# Patient Record
Sex: Female | Born: 1965 | Race: White | Hispanic: No | Smoking: Former smoker
Health system: Southern US, Community
[De-identification: ages and names within clinical notes are randomized; demographics above are authoritative.]

## PROBLEM LIST (undated history)

## (undated) DIAGNOSIS — F32A Depression, unspecified: Secondary | ICD-10-CM

## (undated) DIAGNOSIS — F329 Major depressive disorder, single episode, unspecified: Secondary | ICD-10-CM

## (undated) HISTORY — PX: CARPAL TUNNEL RELEASE: SHX101

---

## 2000-09-06 ENCOUNTER — Emergency Department (HOSPITAL_COMMUNITY): Admission: EM | Admit: 2000-09-06 | Discharge: 2000-09-06 | Payer: Self-pay | Admitting: Emergency Medicine

## 2005-09-21 ENCOUNTER — Emergency Department: Payer: Self-pay | Admitting: Emergency Medicine

## 2009-04-02 ENCOUNTER — Emergency Department: Payer: Self-pay | Admitting: Emergency Medicine

## 2014-05-08 ENCOUNTER — Emergency Department: Payer: Self-pay | Admitting: Emergency Medicine

## 2014-05-08 LAB — CBC
HCT: 33.9 % — AB (ref 35.0–47.0)
HGB: 10.9 g/dL — AB (ref 12.0–16.0)
MCH: 23.6 pg — AB (ref 26.0–34.0)
MCHC: 32.2 g/dL (ref 32.0–36.0)
MCV: 73 fL — AB (ref 80–100)
Platelet: 239 10*3/uL (ref 150–440)
RBC: 4.62 10*6/uL (ref 3.80–5.20)
RDW: 18.3 % — ABNORMAL HIGH (ref 11.5–14.5)
WBC: 8.7 10*3/uL (ref 3.6–11.0)

## 2014-05-08 LAB — WET PREP, GENITAL

## 2014-05-08 LAB — PROTIME-INR
INR: 1
Prothrombin Time: 12.7 secs (ref 11.5–14.7)

## 2014-05-08 LAB — GC/CHLAMYDIA PROBE AMP

## 2016-02-28 ENCOUNTER — Emergency Department: Payer: BLUE CROSS/BLUE SHIELD

## 2016-02-28 ENCOUNTER — Encounter: Payer: Self-pay | Admitting: Emergency Medicine

## 2016-02-28 ENCOUNTER — Emergency Department
Admission: EM | Admit: 2016-02-28 | Discharge: 2016-02-28 | Disposition: A | Discharge status: Home / Self Care | Payer: BLUE CROSS/BLUE SHIELD | Attending: Emergency Medicine | Admitting: Emergency Medicine

## 2016-02-28 DIAGNOSIS — Z87891 Personal history of nicotine dependence: Secondary | ICD-10-CM | POA: Insufficient documentation

## 2016-02-28 DIAGNOSIS — M25562 Pain in left knee: Secondary | ICD-10-CM

## 2016-02-28 DIAGNOSIS — F329 Major depressive disorder, single episode, unspecified: Secondary | ICD-10-CM | POA: Diagnosis not present

## 2016-02-28 DIAGNOSIS — M25462 Effusion, left knee: Secondary | ICD-10-CM | POA: Diagnosis not present

## 2016-02-28 HISTORY — DX: Depression, unspecified: F32.A

## 2016-02-28 HISTORY — DX: Major depressive disorder, single episode, unspecified: F32.9

## 2016-02-28 MED ORDER — NAPROXEN 500 MG PO TABS
500.0000 mg | ORAL_TABLET | Freq: Once | ORAL | Status: AC
  Administered 2016-02-28: 500 mg via ORAL

## 2016-02-28 MED ORDER — NAPROXEN 500 MG PO TABS
ORAL_TABLET | ORAL | Status: AC
  Administered 2016-02-28: 500 mg via ORAL
  Filled 2016-02-28: qty 1

## 2016-02-28 NOTE — Discharge Instructions (Signed)
Knee Pain Knee pain is a common problem. It can have many causes. The pain often goes away by following your doctor's home care instructions. Treatment for ongoing pain will depend on the cause of your pain. If your knee pain continues, more tests may be needed to diagnose your condition. Tests may include X-rays or other imaging studies of your knee. HOME CARE  Take medicines only as told by your doctor.  Rest your knee and keep it raised (elevated) while you are resting.  Do not do things that cause pain or make your pain worse.  Avoid activities where both feet leave the ground at the same time, such as running, jumping rope, or doing jumping jacks.  Apply ice to the knee area:  Put ice in a plastic bag.  Place a towel between your skin and the bag.  Leave the ice on for 20 minutes, 2-3 times a day.  Ask your doctor if you should wear an elastic knee support.  Sleep with a pillow under your knee.  Lose weight if you are overweight. Being overweight can make your knee hurt more.  Do not use any tobacco products, including cigarettes, chewing tobacco, or electronic cigarettes. If you need help quitting, ask your doctor. Smoking may slow the healing of any bone and joint problems that you may have. GET HELP IF:  Your knee pain does not stop, it changes, or it gets worse.  You have a fever along with knee pain.  Your knee gives out or locks up.  Your knee becomes more swollen. GET HELP RIGHT AWAY IF:   Your knee feels hot to the touch.  You have chest pain or trouble breathing.   This information is not intended to replace advice given to you by your health care provider. Make sure you discuss any questions you have with your health care provider.   Document Released: 11/07/2008 Document Revised: 09/01/2014 Document Reviewed: 10/12/2013 Elsevier Interactive Patient Education 2016 Elsevier Inc.  Knee Effusion Knee effusion means that you have extra fluid in your knee.  This can cause pain. Your knee may be more difficult to bend and move. HOME CARE  Use crutches as told by your doctor.  Wear a knee brace as told by your doctor.  Apply ice to the swollen area:  Put ice in a plastic bag.  Place a towel between your skin and the bag.  Leave the ice on for 20 minutes, 2-3 times per day.  Keep your knee raised (elevated) when you are sitting or lying down.  Take medicines only as told by your doctor.  Do any rehabilitation or strengthening exercises as told by your doctor.  Rest your knee as told by your doctor. You may start doing your normal activities again when your doctor says it is okay.  Keep all follow-up visits as told by your doctor. This is important. GET HELP IF:   You continue to have pain in your knee. GET HELP RIGHT AWAY IF:  You have increased swelling or redness of your knee.  You have severe pain in your knee.  You have a fever.   This information is not intended to replace advice given to you by your health care provider. Make sure you discuss any questions you have with your health care provider.   Document Released: 09/13/2010 Document Revised: 09/01/2014 Document Reviewed: 03/27/2014 Elsevier Interactive Patient Education [employer] Elsevier Inc.

## 2016-02-28 NOTE — ED Notes (Signed)
Increasing left leg pain x1 year , "It seem to be getting worse" , no injury recalled

## 2016-02-28 NOTE — ED Provider Notes (Addendum)
[facility] Emergency Department Provider Note   ____________________________________________  Time seen: Approximately 320 PM  I have reviewed the triage vital signs and the nursing notes.   HISTORY  Chief Complaint Leg Pain   HPI Brooke Coffey is a 50 y.o. female with a history of depression was present in the emergency department with left knee pain over one year. She says that the pain has worsened lately. She says that the pain started about a year ago when she was working out at [employer]. 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 She says that she is on her feet constantly present says that she does have good shoes to wear but that the pain worsens throughout the day and radiates from the left knee up the thigh and down the lower leg as well. It is worse with movement. The patient has noted swelling of the knee and occasionally wears a compressive brace. Denies any calf or thigh swelling. No history of DVTs. Says that she has taken ibuprofen several times a week over the course of the pain and this helps.  Past Medical History  Diagnosis Date  . Depression     There are no active problems to display for this patient.   Past Surgical History  Procedure Laterality Date  . Cesarean section    . Carpal tunnel release      No current outpatient prescriptions on file.  Allergies Review of patient's allergies indicates no known allergies.  No family history on file.  Social History Social History  Substance Use Topics  . Smoking status: Former Games developermoker  . Smokeless tobacco: None  . Alcohol Use: Yes    Review of Systems Constitutional: No fever/chills Eyes: No visual changes. ENT: No sore throat. Cardiovascular: Denies chest pain. Respiratory: Denies shortness of breath. Gastrointestinal: No abdominal pain.  No nausea, no vomiting.  No diarrhea.  No constipation. Genitourinary: Negative  for dysuria. Musculoskeletal: Negative for back pain. Skin: Negative for rash. Neurological: Negative for headaches, focal weakness or numbness.  10-point ROS otherwise negative.  ____________________________________________   PHYSICAL EXAM:  VITAL SIGNS: ED Triage Vitals  Enc Vitals Group     BP 02/28/16 1324 143/102 mmHg     Pulse Rate 02/28/16 1324 79     Resp 02/28/16 1324 18     Temp 02/28/16 1324 98.3 F (36.8 C)     Temp Source 02/28/16 1324 Oral     SpO2 02/28/16 1324 99 %     Weight 02/28/16 1324 176 lb (79.833 kg)     Height 02/28/16 1324 5' (1.524 m)     Head Cir --      Peak Flow --      Pain Score 02/28/16 1325 7     Pain Loc --      Pain Edu? --      Excl. in GC? --     Constitutional: Alert and oriented. Well appearing and in no acute distress. Eyes: Conjunctivae are normal. PERRL. EOMI. Head: Atraumatic. Nose: No congestion/rhinnorhea. Mouth/Throat: Mucous membranes are moist.   Neck: No stridor.   Cardiovascular: Normal rate, regular rhythm. Grossly normal heart sounds.  Good peripheral circulation With intact in bilateral dorsalis pedis pulses. Respiratory: Normal respiratory effort.  No retractions. Lungs CTAB. Gastrointestinal: Soft and nontender. No distention. No CVA tenderness. Musculoskeletal: Left knee with a small effusion. No ligamentous laxity. Mild tenderness throughout. The patient is able  to range her knee but with some pain diffusely. No weakness. Sensation is intact to light touch. No swelling to the calf or thigh. Neurologic:  Normal speech and language. No gross focal neurologic deficits are appreciated.  Skin:  Skin is warm, dry and intact. No rash noted. Psychiatric: Mood and affect are normal. Speech and behavior are normal.  ____________________________________________   LABS (all labs ordered are listed, but only abnormal results are displayed)  Labs Reviewed - No data to  display ____________________________________________  EKG   ____________________________________________  RADIOLOGY     DG Knee Complete 4 Views Left (Final result) Result time: 02/28/16 15:55:19   Final result by Rad Results In Interface (02/28/16 15:55:19)   Narrative:   CLINICAL DATA: Left knee pain for 1 year with increasing symptoms over the last week, no known injury, initial encounter  EXAM: LEFT KNEE - COMPLETE 4+ VIEW  COMPARISON: None.  FINDINGS: No evidence of fracture, dislocation, or joint effusion. No evidence of arthropathy or other focal bone abnormality. Soft tissues are unremarkable.  IMPRESSION: No acute abnormality noted.   Electronically Signed By: Alcide CleverMark Lukens M.D. On: 02/28/2016 15:55    US Venous Img Lower Unilateral Left (Final result) Result time: 02/28/16 16:51:33   Final result by Rad Results In Interface (02/28/16 16:51:33)   Narrative:   CLINICAL DATA: Left knee pain and swelling.  EXAM: Left LOWER EXTREMITY VENOUS DOPPLER ULTRASOUND  TECHNIQUE: Gray-scale sonography with graded compression, as well as color Doppler and duplex ultrasound were performed to evaluate the lower extremity deep venous systems from the level of the common femoral vein and including the common femoral, femoral, profunda femoral, popliteal and calf veins including the posterior tibial, peroneal and gastrocnemius veins when visible. The superficial great saphenous vein was also interrogated. Spectral Doppler was utilized to evaluate flow at rest and with distal augmentation maneuvers in the common femoral, femoral and popliteal veins.  COMPARISON: None.  FINDINGS: Contralateral Common Femoral Vein: Respiratory phasicity is normal and symmetric with the symptomatic side. No evidence of thrombus. Normal compressibility.  Common Femoral Vein: No evidence of thrombus.  Saphenofemoral Junction: No evidence of thrombus.  Profunda Femoral  Vein: No evidence of thrombus.  Femoral Vein: No evidence of thrombus.  Popliteal Vein: No evidence of thrombus.  Calf Veins: No evidence of thrombus.  IMPRESSION: Negative for left lower extremity DVT.   Electronically Signed By: Marnee SpringJonathon Watts M.D. On: 02/28/2016 16:51        ____________________________________________   PROCEDURES   Procedures   ____________________________________________   INITIAL IMPRESSION / ASSESSMENT AND PLAN / ED COURSE  Pertinent labs & imaging results that were available during my care of the patient were reviewed by me and considered in my medical decision making (see chart for details).  History and physical exam are consistent with arthritis and arthritic effusion. We will obtain knee radiographs. Ran likely to be a DVT. No calf or thigh swelling. Ongoing pain for a year which makes that much less likely as well.  ----------------------------------------- 4:19 PM on 02/28/2016 -----------------------------------------  No arthritis found on the x-ray of the knee. Will continue with lower extremity ultrasound to make sure there is no DVT. If DVT study is negative and I will refer the patient orthopedics. Although she does not have any sign of arthritis there is no obvious small effusion and this may be secondary to overuse that she is having symptoms.  ----------------------------------------- 5:43 PM on 02/28/2016 -----------------------------------------  Patient resting comfortably at this time and says  that the pain is been greatly improved with Naprosyn. Reassuring workup as far as her plain films and ultrasound go. However, does sound like the symptoms are likely musculoskeletal related. It is not ruled out. The patient could have a ligamentous injury or meniscus injury. She will continue supportive care at home. She says that she'll be picking up over-the-counter NSAIDs as well as supplements such as Biaxin. She'll follow up  with her primary care doctor for further workup for this left knee pain. I will also give the phone number for orthopedics. Will be discharged home. ____________________________________________   FINAL CLINICAL IMPRESSION(S) / ED DIAGNOSES  Left knee pain with effusion.   NEW MEDICATIONS STARTED DURING THIS VISIT:  New Prescriptions   No medications on file     Note:  This document was prepared using Dragon voice recognition software and may include unintentional dictation errors.    Myrna Blazer, MD 02/28/16 1744  Myrna Blazer, MD 02/28/16 1750  Blood pressure rechecked and was 150/88.  Myrna Blazer, MD 02/28/16 [phone]

## 2017-04-22 ENCOUNTER — Other Ambulatory Visit: Payer: Self-pay | Admitting: Nurse Practitioner

## 2017-04-22 DIAGNOSIS — Z1239 Encounter for other screening for malignant neoplasm of breast: Secondary | ICD-10-CM

## 2017-12-30 IMAGING — US US EXTREM LOW VENOUS*L*
1 series · 13 of 24 positions shown · non-contrast
Comparison: None.

CLINICAL DATA: Left knee pain and swelling.



[Series 1: us extrem low venous*left* · 0.08mm/px · 13 of 32 slices shown]
[im 1/32]
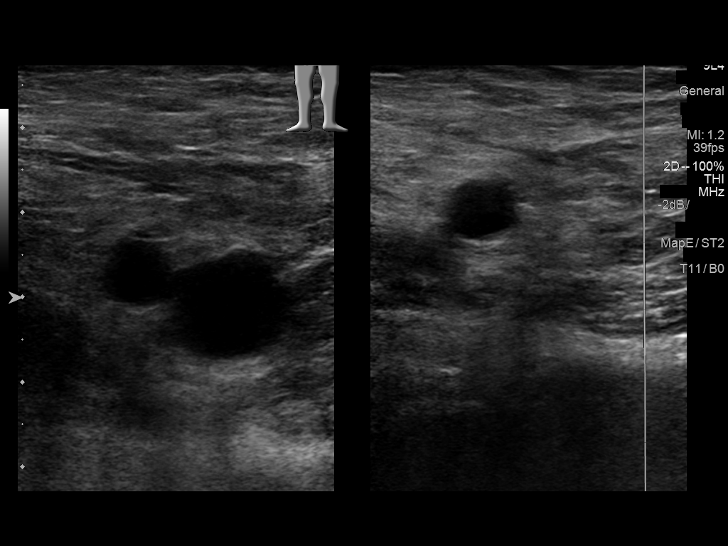
[im 3/32]
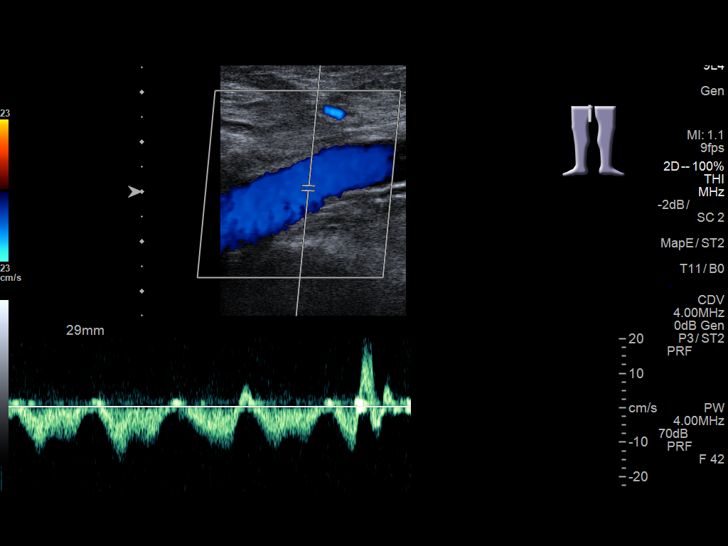
[im 6/32]
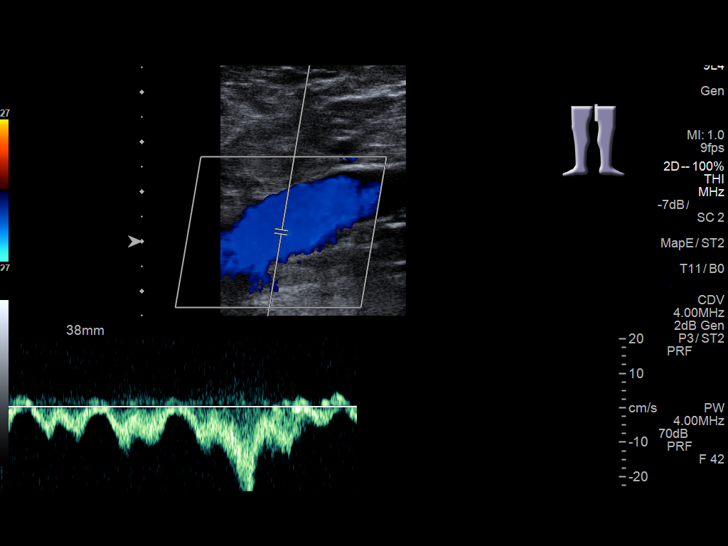
[im 9/32]
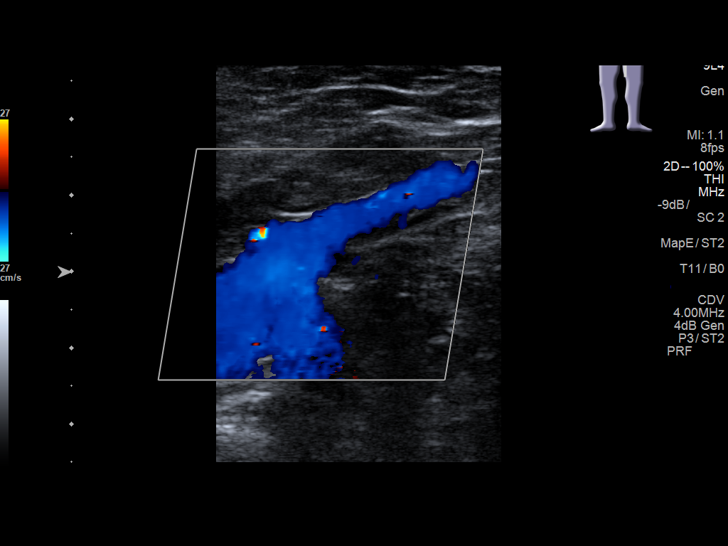
[im 11/32]
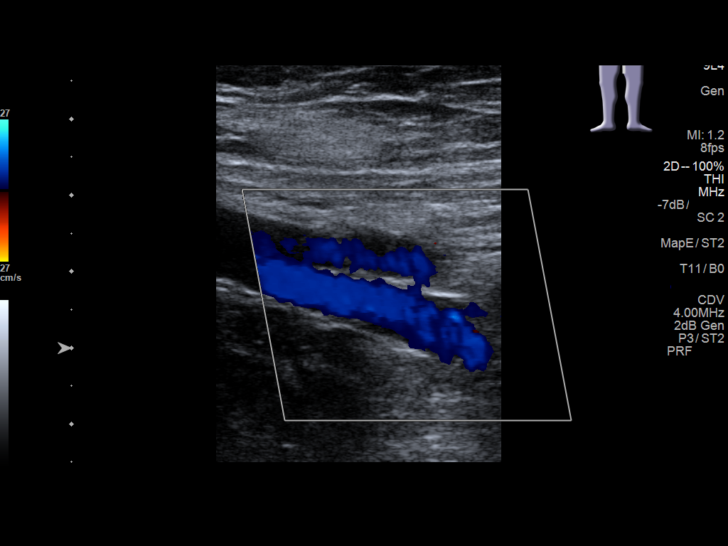
[im 14/32]
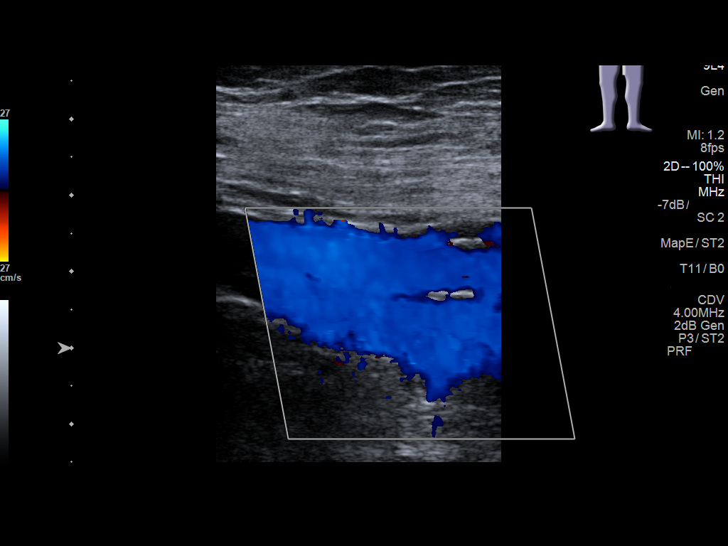
[im 17/32]
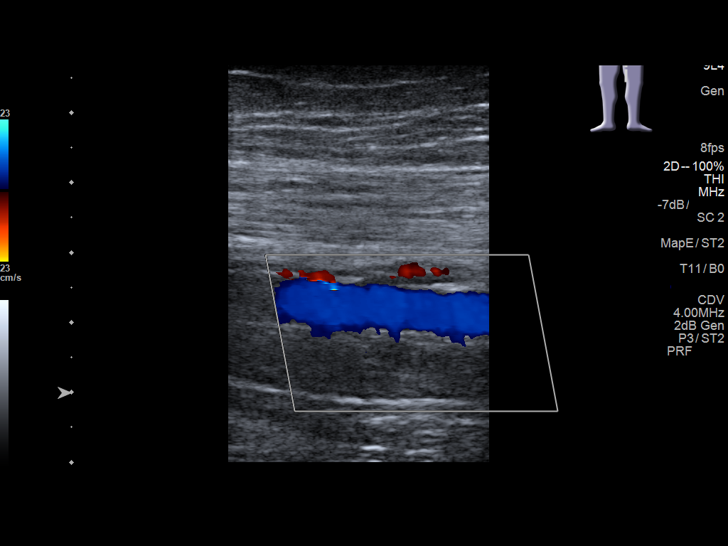
[im 18/32]
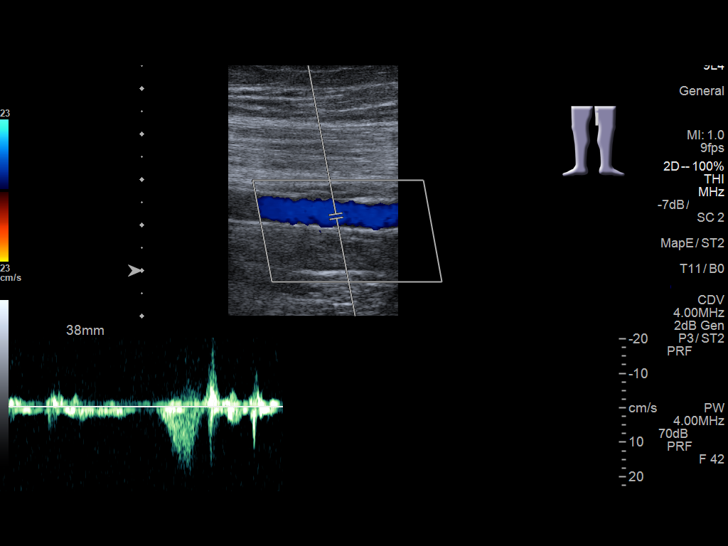
[im 21/32]
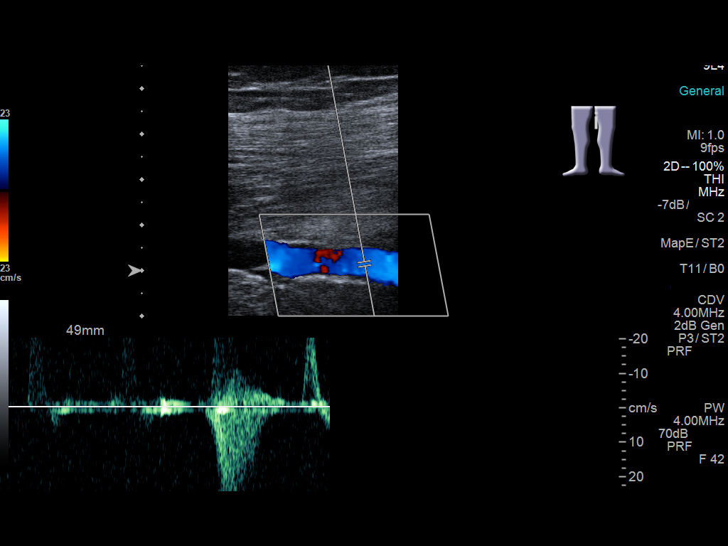
[im 23/32]
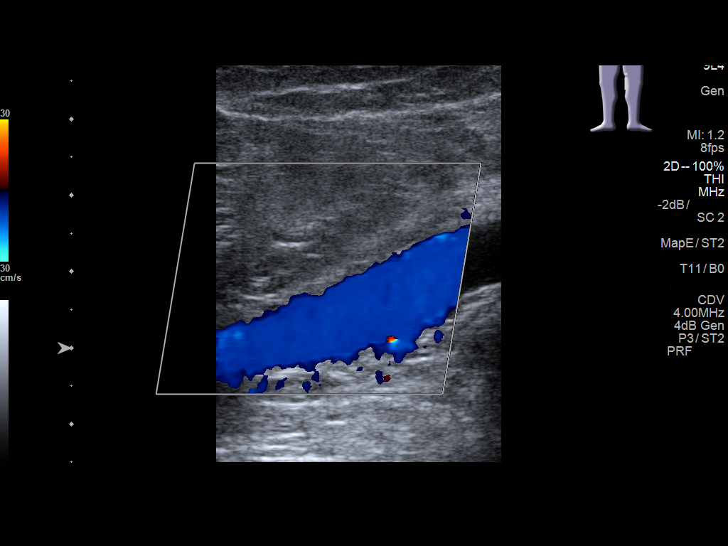
[im 26/32]
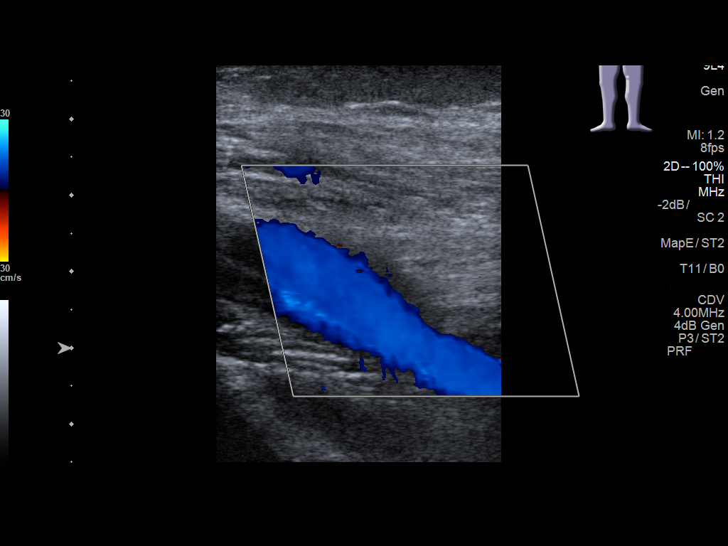
[im 29/32]
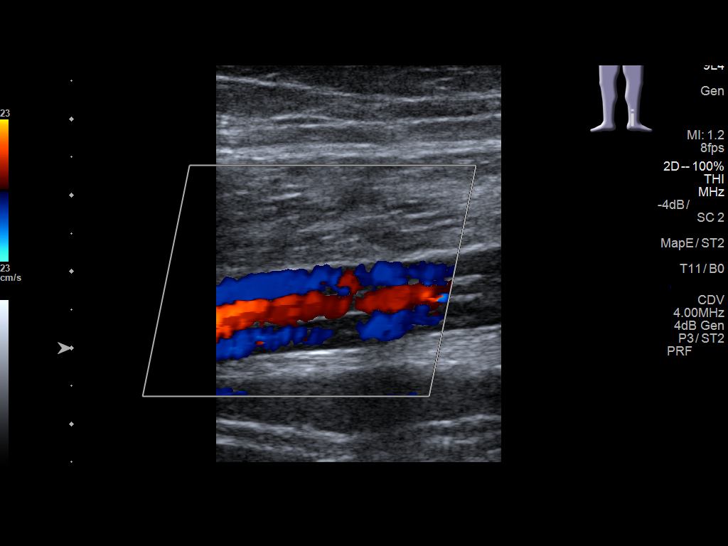
[im 32/32]
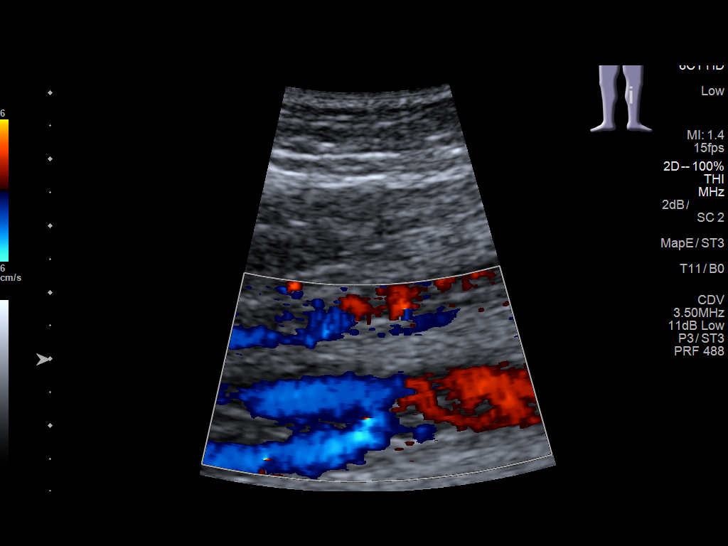

[13 of 24 positions shown; findings below may reference images not displayed]

FINDINGS: Contralateral Common Femoral Vein: Respiratory phasicity is normal
and symmetric with the symptomatic side. No evidence of thrombus.
Normal compressibility.

Common Femoral Vein: No evidence of thrombus.

Saphenofemoral Junction: No evidence of thrombus.

Profunda Femoral Vein: No evidence of thrombus.

Femoral Vein: No evidence of thrombus.

Popliteal Vein: No evidence of thrombus.

Calf Veins: No evidence of thrombus.
IMPRESSION: Negative for left lower extremity DVT.

## 2018-03-01 ENCOUNTER — Other Ambulatory Visit: Payer: Self-pay | Admitting: Nurse Practitioner

## 2018-03-01 DIAGNOSIS — Z1231 Encounter for screening mammogram for malignant neoplasm of breast: Secondary | ICD-10-CM

## 2018-09-10 ENCOUNTER — Other Ambulatory Visit: Payer: Self-pay

## 2018-09-10 ENCOUNTER — Encounter: Payer: Self-pay | Admitting: Emergency Medicine

## 2018-09-10 ENCOUNTER — Emergency Department
Admission: EM | Admit: 2018-09-10 | Discharge: 2018-09-10 | Disposition: A | Discharge status: Home / Self Care | Payer: BLUE CROSS/BLUE SHIELD | Attending: Emergency Medicine | Admitting: Emergency Medicine

## 2018-09-10 DIAGNOSIS — M79642 Pain in left hand: Secondary | ICD-10-CM | POA: Diagnosis present

## 2018-09-10 DIAGNOSIS — M65312 Trigger thumb, left thumb: Secondary | ICD-10-CM

## 2018-09-10 DIAGNOSIS — M654 Radial styloid tenosynovitis [de Quervain]: Secondary | ICD-10-CM | POA: Diagnosis not present

## 2018-09-10 MED ORDER — MELOXICAM 15 MG PO TABS: 15.0000 mg | ORAL_TABLET | Freq: Every day | ORAL | 0 refills | Status: DC

## 2018-09-10 NOTE — ED Provider Notes (Signed)
[facility] Emergency Department Provider Note  ____________________________________________  Time seen: Approximately 7:32 PM  I have reviewed the triage vital signs and the nursing notes.   HISTORY  Chief Complaint Hand Pain    HPI Brooke Coffey is a 53 y.o. 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  Patient reports that approximately a month ago she started to have a burning sensation to the posterior aspect of her thumb.  Patient reports that it felt like carpal tunnel but she is already had a carpal tunnel release and did not believe that she can have one for her thumb.  Patient reports that the pain has worsened and today she had a catching sensation where after bending her thumb in flexion, it remained in flexion until she "popped loose."  Patient has a similar issue with the ring finger of the same hand where it will "catch" in a flexed position.  Patient does not take any medication for his complaint prior to arrival.  Patient does have a history of carpal tunnel both hands with release that occur approximately 35 years ago.    Past Medical History:  Diagnosis Date  . Depression     There are no active problems to display for this patient.   Past Surgical History:  Procedure Laterality Date  . CARPAL TUNNEL RELEASE    . CESAREAN SECTION      Prior to Admission medications   Medication Sig Start Date End Date Taking? Authorizing Provider  meloxicam (MOBIC) 15 MG tablet Take 1 tablet (15 mg total) by mouth daily. 09/10/18   Amparo Donalson, Delorise RoyalsJonathan D, PA-C    Allergies Patient has no known allergies.  No family history on file.  Social History Social History   Tobacco Use  . Smoking status: Former Games developermoker  . Smokeless tobacco: Never Used  Substance Use Topics  . Alcohol use: Yes  . Drug use:  Not on file     Review of Systems  Constitutional: No fever/chills Eyes: No visual changes. Cardiovascular: no chest pain. Respiratory: no cough. No SOB. Gastrointestinal: No abdominal pain.  No nausea, no vomiting.  Musculoskeletal: Positive for left thumb pain, catching sensation to the left thumb Skin: Negative for rash, abrasions, lacerations, ecchymosis. Neurological: Negative for headaches, focal weakness or numbness. 10-point ROS otherwise negative.  ____________________________________________   PHYSICAL EXAM:  VITAL SIGNS: ED Triage Vitals  Enc Vitals Group     BP 09/10/18 1728 140/88     Pulse Rate 09/10/18 1728 67     Resp 09/10/18 1728 16     Temp 09/10/18 1728 98.2 F (36.8 C)     Temp Source 09/10/18 1728 Oral     SpO2 09/10/18 1728 100 %     Weight 09/10/18 1724 201 lb (91.2 kg)     Height 09/10/18 1724 5\' 1"  (1.549 m)     Head Circumference --      Peak Flow --      Pain Score 09/10/18 1723 6     Pain Loc --      Pain Edu? --      Excl. in GC? --      Constitutional: Alert and oriented. Well appearing and in no acute distress. Eyes: Conjunctivae are normal. PERRL. EOMI. Head: Atraumatic. Neck: No stridor.    Cardiovascular: Normal rate, regular rhythm. Normal S1  and S2.  Good peripheral circulation. Respiratory: Normal respiratory effort without tachypnea or retractions. Lungs CTAB. Good air entry to the bases with no decreased or absent breath sounds. Musculoskeletal: Full range of motion to all extremities. No gross deformities appreciated.  Visualization of the left hand reveals no visible edema, erythema, ecchymosis.  Patient is able to extend and flex the thumb appropriately.  Positive Finkelstein's.  No trigger thumb is identified at this time.  Sensation intact to the digit.  Capillary refill less than 2 seconds to the digit. Neurologic:  Normal speech and language. No gross focal neurologic deficits are appreciated.  Skin:  Skin is warm, dry and  intact. No rash noted. Psychiatric: Mood and affect are normal. Speech and behavior are normal. Patient exhibits appropriate insight and judgement.   ____________________________________________   LABS (all labs ordered are listed, but only abnormal results are displayed)  Labs Reviewed - No data to display ____________________________________________  EKG   ____________________________________________  RADIOLOGY   No results found.  ____________________________________________    PROCEDURES  Procedure(s) performed:    Procedures    Medications - No data to display   ____________________________________________   INITIAL IMPRESSION / ASSESSMENT AND PLAN / ED COURSE  Pertinent labs & imaging results that were available during my care of the patient were reviewed by me and considered in my medical decision making (see chart for details).  Review of the [place] CSRS was performed in accordance of the [facility] prior to dispensing any controlled drugs.      Patient's diagnosis is consistent with de Quervain's tenosynovitis and trigger thumb.  Patient presents emergency department complaints of her left thumb over the last month.  Exam is consistent with de Quervain's and history is consistent with trigger thumb.  Patient is placed in thumb spica splint and will be placed on meloxicam for 1 month.  If symptoms do not improve follow-up with hand surgery..  Patient is given ED precautions to return to the ED for any worsening or new symptoms.     ____________________________________________  FINAL CLINICAL IMPRESSION(S) / ED DIAGNOSES  Final diagnoses:  De Quervain's tenosynovitis, left  Trigger finger of left thumb      NEW MEDICATIONS STARTED DURING THIS VISIT:  ED Discharge Orders         Ordered    meloxicam (MOBIC) 15 MG tablet  Daily     09/10/18 1940              This chart was dictated using voice recognition software/Dragon. Despite best efforts  to proofread, errors can occur which can change the meaning. Any change was purely unintentional.    Racheal Patches, PA-C 09/10/18 1943    Arnaldo Natal, MD 09/11/18 0111

## 2018-09-10 NOTE — ED Triage Notes (Signed)
Pt to ED via POV, pt c/o pain and swelling in the left thumb. Pt states that this has been going on for a while but has gotten worse over the last few days.

## 2018-09-10 NOTE — ED Notes (Signed)
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

## 2020-08-29 ENCOUNTER — Other Ambulatory Visit: Payer: BC Managed Care – PPO

## 2020-08-29 DIAGNOSIS — Z20822 Contact with and (suspected) exposure to covid-19: Secondary | ICD-10-CM

## 2020-08-31 LAB — SARS-COV-2, NAA 2 DAY TAT

## 2020-08-31 LAB — NOVEL CORONAVIRUS, NAA: SARS-CoV-2, NAA: NOT DETECTED

## 2020-11-25 ENCOUNTER — Emergency Department: Payer: BC Managed Care – PPO

## 2020-11-25 ENCOUNTER — Emergency Department
Admission: EM | Admit: 2020-11-25 | Discharge: 2020-11-25 | Disposition: A | Discharge status: Home / Self Care | Payer: BC Managed Care – PPO | Attending: Emergency Medicine | Admitting: Emergency Medicine

## 2020-11-25 ENCOUNTER — Encounter: Payer: Self-pay | Admitting: Emergency Medicine

## 2020-11-25 ENCOUNTER — Other Ambulatory Visit: Payer: Self-pay

## 2020-11-25 DIAGNOSIS — Z87891 Personal history of nicotine dependence: Secondary | ICD-10-CM | POA: Diagnosis not present

## 2020-11-25 DIAGNOSIS — R002 Palpitations: Secondary | ICD-10-CM | POA: Insufficient documentation

## 2020-11-25 DIAGNOSIS — R059 Cough, unspecified: Secondary | ICD-10-CM | POA: Insufficient documentation

## 2020-11-25 LAB — CBC
HCT: 44.4 % (ref 36.0–46.0)
Hemoglobin: 14.5 g/dL (ref 12.0–15.0)
MCH: 28.2 pg (ref 26.0–34.0)
MCHC: 32.7 g/dL (ref 30.0–36.0)
MCV: 86.4 fL (ref 80.0–100.0)
Platelets: 261 10*3/uL (ref 150–400)
RBC: 5.14 MIL/uL — ABNORMAL HIGH (ref 3.87–5.11)
RDW: 13 % (ref 11.5–15.5)
WBC: 6.8 10*3/uL (ref 4.0–10.5)
nRBC: 0 % (ref 0.0–0.2)

## 2020-11-25 LAB — BASIC METABOLIC PANEL
Anion gap: 8 (ref 5–15)
BUN: 18 mg/dL (ref 6–20)
CO2: 24 mmol/L (ref 22–32)
Calcium: 8.9 mg/dL (ref 8.9–10.3)
Chloride: 106 mmol/L (ref 98–111)
Creatinine, Ser: 0.86 mg/dL (ref 0.44–1.00)
GFR, Estimated: >60 mL/min (ref >60)
Glucose, Bld: 84 mg/dL (ref 70–99)
Potassium: 4.2 mmol/L (ref 3.5–5.1)
Sodium: 138 mmol/L (ref 135–145)

## 2020-11-25 LAB — TROPONIN I (HIGH SENSITIVITY): Troponin I (High Sensitivity): 3 ng/L (ref <18)

## 2020-11-25 LAB — MAGNESIUM: Magnesium: 2.3 mg/dL (ref 1.7–2.4)

## 2020-11-25 NOTE — ED Provider Notes (Signed)
[facility] Emergency Department Provider Note ____________________________________________   Event Date/Time   First MD Initiated Contact with Patient 11/25/20 [phone]     (approximate)  I have reviewed the triage vital signs and the nursing notes.  HISTORY  Chief Complaint Irregular Heart Beat   HPI Brooke Coffey is a 55 y.o. femalewho presents to the ED for evaluation of palpitations.  Chart review indicates depression on Wellbutrin.  Patient ports that she was at work, at [employer], when she started feeling a fluttering sensation and palpitations within her chest.  She reports associated nonproductive cough during her palpitations, but also since resolved.  She denies associated chest pain, back pain, shortness of breath, syncope, trauma, emesis, diaphoresis.  She reports her palpitations lasted a few minutes before self resolving and that she feels better now.  She reports her boss wanted her to get checked out.  She denies recent illnesses.   Past Medical History:  Diagnosis Date  . Depression     There are no problems to display for this patient.   Past Surgical History:  Procedure Laterality Date  . CARPAL TUNNEL RELEASE    . CESAREAN SECTION      Prior to Admission medications   Medication Sig Start Date End Date Taking? Authorizing Provider  meloxicam (MOBIC) 15 MG tablet Take 1 tablet (15 mg total) by mouth daily. 09/10/18   Cuthriell, Delorise Royals, PA-C    Allergies Patient has no known allergies.  No family history on file.  Social History Social History   Tobacco Use  . Smoking status: Former Games developer  . Smokeless tobacco: Never Used  Substance Use Topics  . Alcohol use: Yes    Review of Systems  Constitutional: No fever/chills Eyes: No visual changes. ENT: No sore throat. Cardiovascular: Denies chest pain.  Positive for palpitations. Respiratory: Denies shortness of breath. Gastrointestinal: No abdominal pain.  No  nausea, no vomiting.  No diarrhea.  No constipation. Genitourinary: Negative for dysuria. Musculoskeletal: Negative for back pain. Skin: Negative for rash. Neurological: Negative for headaches, focal weakness or numbness.  ____________________________________________   PHYSICAL EXAM:  VITAL SIGNS: Vitals:   11/25/20 1000 11/25/20 1124  BP: 134/87 136/75  Pulse: 73 77  Resp: 17 20  Temp:  97.8 F (36.6 C)  SpO2: 99% 100%     Constitutional: Alert and oriented. Well appearing and in no acute distress. Eyes: Conjunctivae are normal. PERRL. EOMI. Head: Atraumatic. Nose: No congestion/rhinnorhea. Mouth/Throat: Mucous membranes are moist.  Oropharynx non-erythematous. Neck: No stridor. No cervical spine tenderness to palpation. Cardiovascular: Normal rate, regular rhythm. Grossly normal heart sounds.  Good peripheral circulation. Respiratory: Normal respiratory effort.  No retractions. Lungs CTAB. Gastrointestinal: Soft , nondistended, nontender to palpation. No CVA tenderness. Musculoskeletal: No lower extremity tenderness nor edema.  No joint effusions. No signs of acute trauma. Neurologic:  Normal speech and language. No gross focal neurologic deficits are appreciated. No gait instability noted. Skin:  Skin is warm, dry and intact. No rash noted. Psychiatric: Mood and affect are normal. Speech and behavior are normal.  ____________________________________________   LABS (all labs ordered are listed, but only abnormal results are displayed)  Labs Reviewed  CBC - Abnormal; Notable for the following components:      Result Value   RBC 5.14 (*)    All other components within normal limits  BASIC METABOLIC PANEL  MAGNESIUM  TROPONIN I (HIGH SENSITIVITY)  TROPONIN I (HIGH SENSITIVITY)   ____________________________________________  12 Lead EKG  Sinus rhythm, rate of 69 bpm.  Normal axis and intervals.  No evidence of acute  ischemia. ____________________________________________  RADIOLOGY  ED MD interpretation: 2 view CXR reviewed by me without evidence of acute cardiopulmonary pathology  Official radiology report(s): DG Chest 2 View  Result Date: 11/25/2020 CLINICAL DATA:  Dyspnea, palpitations EXAM: CHEST - 2 VIEW COMPARISON:  None. FINDINGS: Normal heart size. Normal mediastinal contour. No pneumothorax. No pleural effusion. Lungs appear clear, with no acute consolidative airspace disease and no pulmonary edema. IMPRESSION: No active cardiopulmonary disease. Electronically Signed   By: Delbert Phenix M.D.   On: 11/25/2020 10:32    ____________________________________________   PROCEDURES and INTERVENTIONS  Procedure(s) performed (including Critical Care):  .1-3 Lead EKG Interpretation Performed by: Delton Prairie, MD Authorized by: Delton Prairie, MD     Interpretation: normal     ECG rate:  74   ECG rate assessment: normal     Rhythm: sinus rhythm     Ectopy: none     Conduction: normal      Medications - No data to display  ____________________________________________   MDM / ED COURSE   55 year old female without cardiac history presents to the ED with isolated palpitations without evidence of acute pathology and amenable to outpatient management.  Normal vital signs on room air.  Exam without evidence of acute pathology or any acute derangements.  She looks well without distress, neurovascular deficits, signs of trauma or any distress.  Work-up is benign.  Blood work with normal electrolytes.  EKG is nonischemic and troponin is negative.  CXR is clear without evidence of pneumonia or PTX causing her cough.  Doubt ACS considering lack of pain, shortness of breath or anginal equivalent. We discussed return precautions for the ED and patient stable for outpatient management.    ____________________________________________   FINAL CLINICAL IMPRESSION(S) / ED DIAGNOSES  Final diagnoses:   Palpitations     ED Discharge Orders    None       Jas Betten   Note:  This document was prepared using Dragon voice recognition software and may include unintentional dictation errors.   Delton Prairie, MD 11/25/20 1225

## 2020-11-25 NOTE — ED Notes (Signed)
Pt ambulatory to treatment room. Reports feeling her heart flutter at work that made her cough. +shob, but states is anxious.  Denies n/v. Denies cp. Denies cardiac hx Speaking in complete sentences, RR Even and unlabored.

## 2020-11-25 NOTE — Discharge Instructions (Signed)
If you develop any worsening palpitations that will not go away, palpitations with chest pain, fevers or passing out, please return to the ED.

## 2020-11-25 NOTE — ED Triage Notes (Signed)
Pt reports she was at work and felt her heart start to flutter and beat irregular. Pt denies pain or SOB but felt concerned.

## 2021-01-07 ENCOUNTER — Other Ambulatory Visit: Payer: Self-pay | Admitting: Family Medicine

## 2021-01-07 DIAGNOSIS — Z1231 Encounter for screening mammogram for malignant neoplasm of breast: Secondary | ICD-10-CM

## 2021-09-06 IMAGING — CR DG CHEST 2V
1 series · 2 of 2 positions shown · non-contrast
Comparison: None.

CLINICAL DATA: Dyspnea, palpitations

EXAM:
CHEST - 2 VIEW

[Series 1: dg chest 2 view · 0.14mm/px · 2 of 2 slices shown]
[im 1/2]
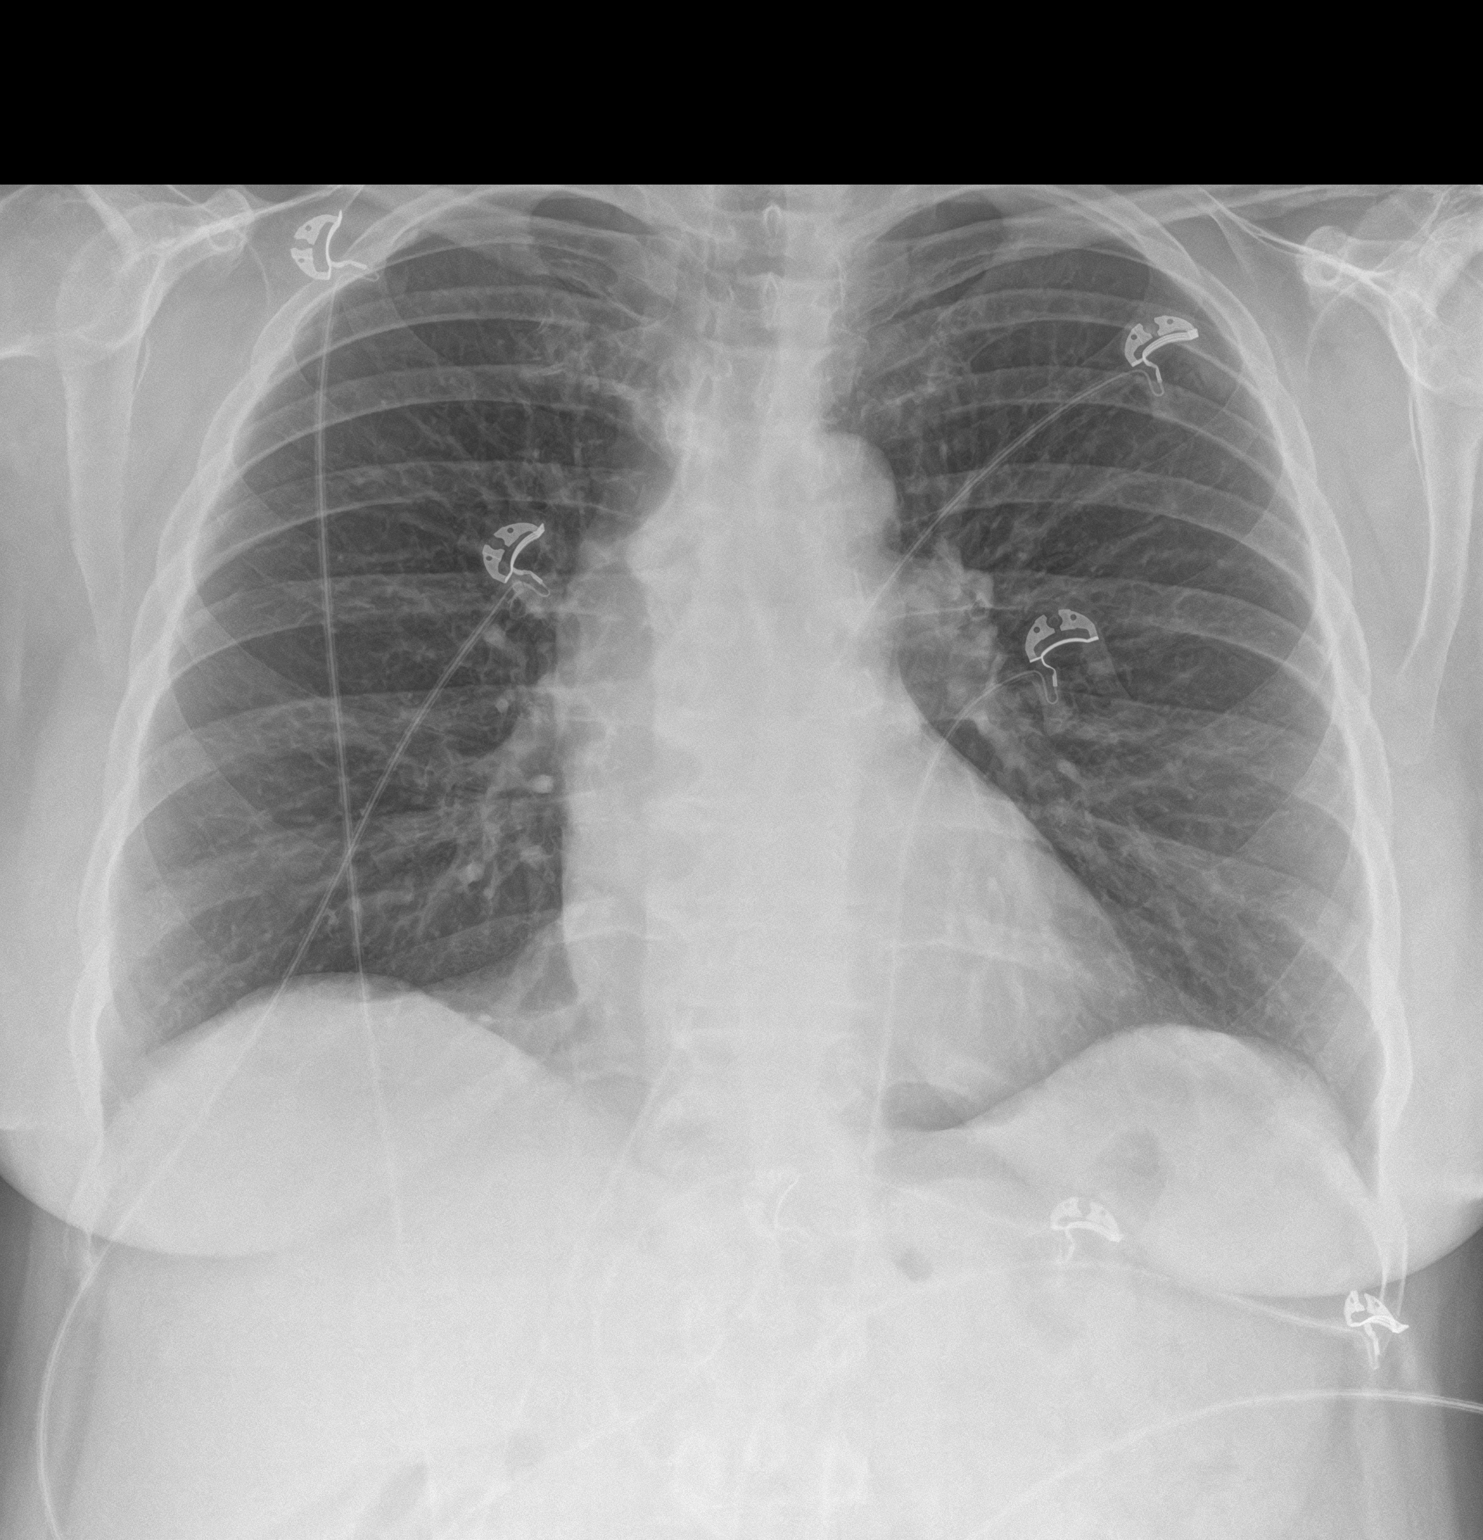
[im 2/2]
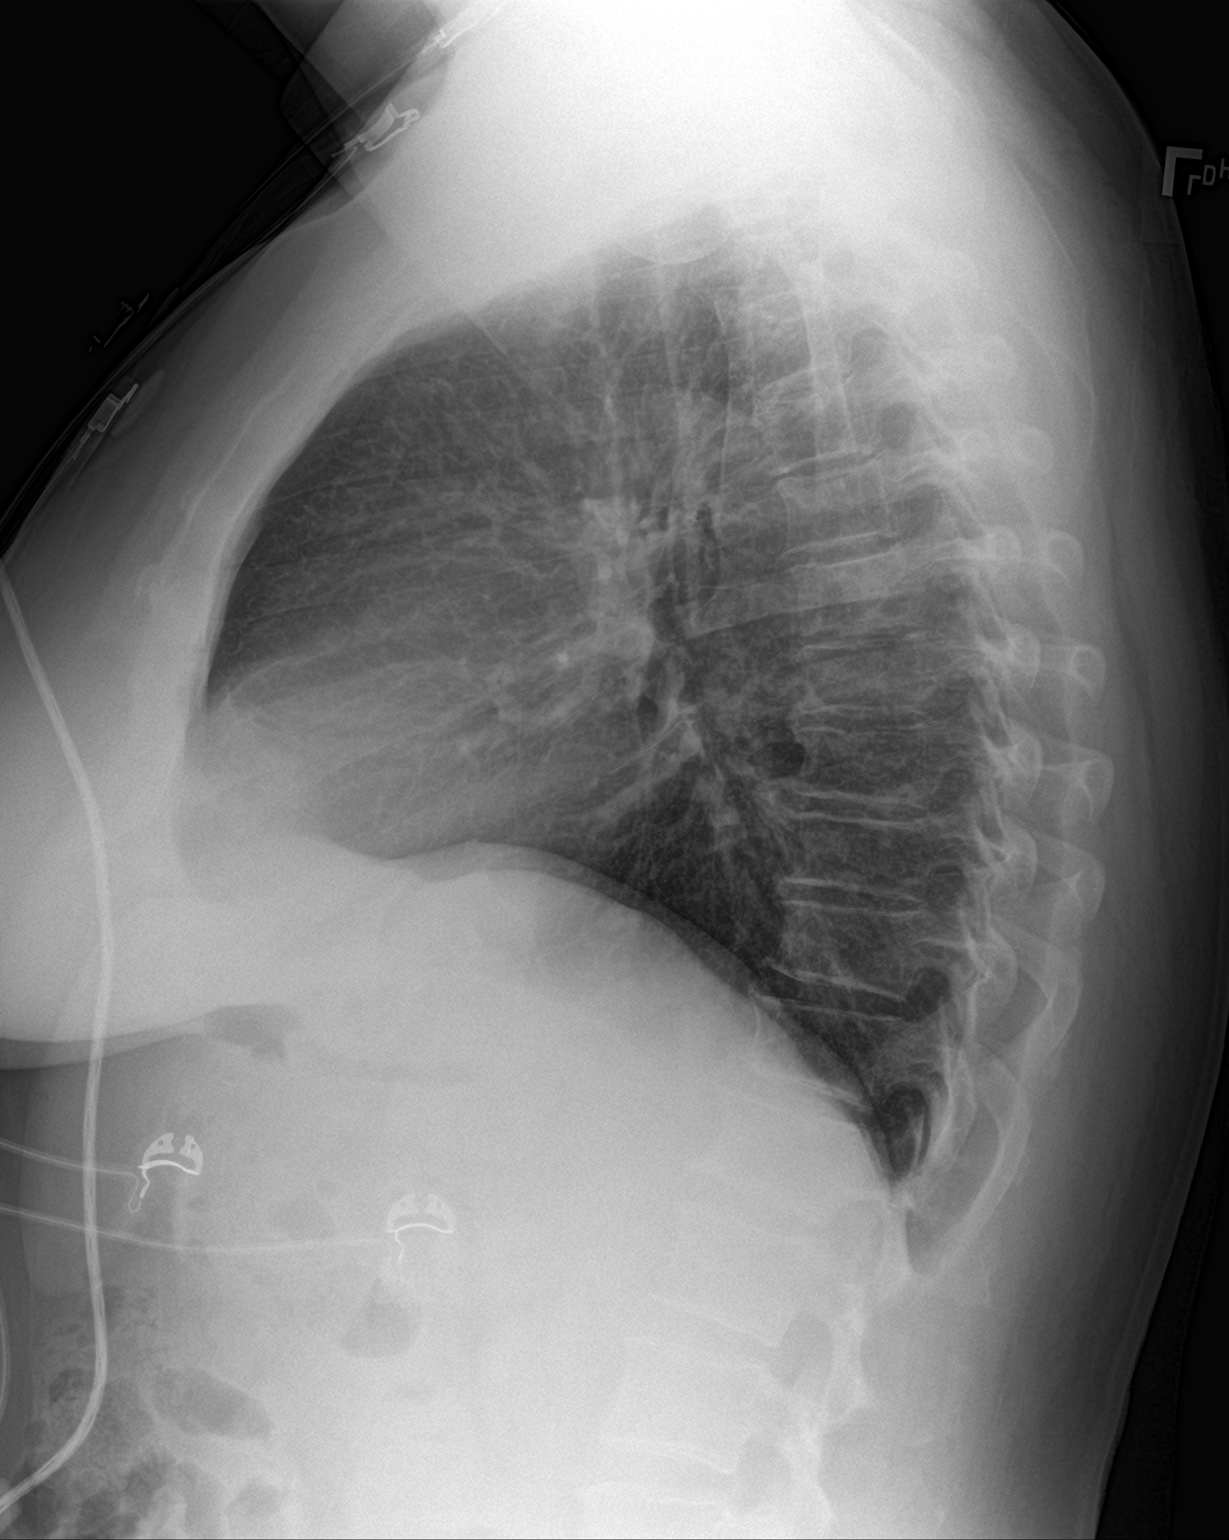

[2 of 2 positions shown; findings below may reference images not displayed]

FINDINGS: Normal heart size. Normal mediastinal contour. No pneumothorax. No
pleural effusion. Lungs appear clear, with no acute consolidative
airspace disease and no pulmonary edema.
IMPRESSION: No active cardiopulmonary disease.

## 2023-02-02 ENCOUNTER — Other Ambulatory Visit: Payer: Self-pay | Admitting: Family Medicine

## 2023-02-02 DIAGNOSIS — Z1231 Encounter for screening mammogram for malignant neoplasm of breast: Secondary | ICD-10-CM

## 2023-10-18 ENCOUNTER — Emergency Department: Payer: Medicaid Other

## 2023-10-18 ENCOUNTER — Emergency Department
Admission: EM | Admit: 2023-10-18 | Discharge: 2023-10-18 | Disposition: A | Discharge status: Home / Self Care | Payer: Medicaid Other | Attending: Student in an Organized Health Care Education/Training Program | Admitting: Student in an Organized Health Care Education/Training Program

## 2023-10-18 ENCOUNTER — Other Ambulatory Visit: Payer: Self-pay

## 2023-10-18 DIAGNOSIS — M25571 Pain in right ankle and joints of right foot: Secondary | ICD-10-CM | POA: Insufficient documentation

## 2023-10-18 MED ORDER — KETOROLAC TROMETHAMINE 15 MG/ML IJ SOLN
15.0000 mg | Freq: Once | INTRAMUSCULAR | Status: AC
  Administered 2023-10-18: 15 mg via INTRAMUSCULAR
  Filled 2023-10-18: qty 1

## 2023-10-18 NOTE — Discharge Instructions (Addendum)
 The xrays of your ankle did not show a fracture. There is a small joint effusion or swelling within the joint as well as some degenerative changes. I believe your pain is from overuse. The best way for this to heal is with rest and by taking anti-inflammatories. Ice and elevate your ankle when you are resting. You can also apply an ace bandage if you find that to be more comfortable. I recommend you take 650 mg of Tylenol and 600 mg of ibuprofen every 6 hours as needed for pain. You can continue to use the icy hot if you find that to be helpful. Wear the walking boot whenever you are up walking around. This will help support and protect your ankle. You can follow up with podiatry who's information is attached.

## 2023-10-18 NOTE — ED Notes (Signed)
 See triage note  Presents with pain to right ankle  States she developed pain couple of days ago  Denies any injury Good pulses  No deformity

## 2023-10-18 NOTE — ED Provider Notes (Signed)
 [facility] Provider Note    Event Date/Time   First MD Initiated Contact with Patient 10/18/23 [phone]     (approximate)   History   Ankle Pain   HPI  Brooke Coffey is a 58 y.o. female with PMH of depression presents for evaluation of right ankle pain and swelling that began yesterday.  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  She denies any known injury.  No trauma or twisting of the ankle.  Patient states that it has been difficult to walk on it.  She describes the pain as being constant even when she is not putting weight on it.      Physical Exam   Triage Vital Signs: ED Triage Vitals  Encounter Vitals Group     BP 10/18/23 0940 (!) 155/97     Systolic BP Percentile --      Diastolic BP Percentile --      Pulse Rate 10/18/23 0940 87     Resp 10/18/23 0940 20     Temp 10/18/23 0940 97.6 F (36.4 C)     Temp Source 10/18/23 0940 Oral     SpO2 10/18/23 0940 100 %     Weight 10/18/23 0938 175 lb (79.4 kg)     Height 10/18/23 0938 5' (1.524 m)     Head Circumference --      Peak Flow --      Pain Score 10/18/23 0937 10     Pain Loc --      Pain Education --      Exclude from Growth Chart --     Most recent vital signs: Vitals:   10/18/23 0940  BP: (!) 155/97  Pulse: 87  Resp: 20  Temp: 97.6 F (36.4 C)  SpO2: 100%   General: Awake, no distress.  CV:  Good peripheral perfusion.  Resp:  Normal effort.  Abd:  No distention.  Other:  Right ankle is swollen when compared to the left but there are no overlying skin changes or bruising.  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  Tender to palpation over the anterior ankle joint, distal lateral malleolus and talofibular ligaments.  ROM maintained but does elicit pain.  4/5 strength in right ankle compared to the left.  Dorsalis pedis pulses 2+ and regular.  Sensation intact across all dermatomes.   ED Results / Procedures / Treatments   Labs (all labs ordered are  listed, but only abnormal results are displayed) Labs Reviewed - No data to display   RADIOLOGY  Right ankle x-ray obtained, I interpreted the images as well as reviewed the radiologist report.  X-ray negative for fracture but does show small joint effusion and degenerative changes.   PROCEDURES:  Critical Care performed: No  Procedures   MEDICATIONS ORDERED IN ED: Medications  ketorolac (TORADOL) 15 MG/ML injection 15 mg (has no administration in time range)     IMPRESSION / MDM / ASSESSMENT AND PLAN / ED COURSE  I reviewed the triage vital signs and the nursing notes.                             58 year old female presents for evaluation of right ankle pain.  Patient was hypertensive in triage otherwise vital signs are stable.  Patient somewhat uncomfortable on exam.  Differential diagnosis includes, but is not limited to, ankle sprain, ankle fracture, ankle dislocation, tendinopathy, muscle  strain, gout.  Patient's presentation is most consistent with acute complicated illness / injury requiring diagnostic workup.  Right ankle x-ray was negative for fracture but does show small joint effusion and degenerative changes.  I believe patient's pain is from overuse.  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  She was given a walking boot.  I recommended anti-inflammatories.  She is unable to pick up ibuprofen today so she was given Toradol while in the ED instead.  She was given follow-up with podiatry.  She voiced understanding, all questions were answered and she was stable at discharge.     FINAL CLINICAL IMPRESSION(S) / ED DIAGNOSES   Final diagnoses:  Acute right ankle pain     Rx / DC Orders   ED Discharge Orders     None        Note:  This document was prepared using Dragon voice recognition software and may include unintentional dictation errors.   Cameron Ali, PA-C 10/18/23 1039    Willy Eddy, MD 10/18/23 1120

## 2023-10-18 NOTE — ED Triage Notes (Signed)
 Pt to ED for R ankle pain and swelling that started yesterday. 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 No known injury. No pain meds taken today. Pt is limping.
